# Patient Record
Sex: Female | Born: 1980 | Race: White | Hispanic: No | Marital: Married | Smoking: Never smoker
Health system: Southern US, Community
[De-identification: ages and names within clinical notes are randomized; demographics above are authoritative.]

## PROBLEM LIST (undated history)

## (undated) DIAGNOSIS — Z789 Other specified health status: Secondary | ICD-10-CM

## (undated) DIAGNOSIS — D649 Anemia, unspecified: Secondary | ICD-10-CM

## (undated) DIAGNOSIS — Z8619 Personal history of other infectious and parasitic diseases: Secondary | ICD-10-CM

## (undated) HISTORY — DX: Anemia, unspecified: D64.9

## (undated) HISTORY — PX: DILATION AND CURETTAGE OF UTERUS: SHX78

## (undated) HISTORY — DX: Personal history of other infectious and parasitic diseases: Z86.19

---

## 2003-09-29 ENCOUNTER — Emergency Department (HOSPITAL_COMMUNITY): Admission: AD | Admit: 2003-09-29 | Discharge: 2003-09-29 | Payer: Self-pay | Admitting: Family Medicine

## 2003-12-11 ENCOUNTER — Emergency Department (HOSPITAL_COMMUNITY): Admission: EM | Admit: 2003-12-11 | Discharge: 2003-12-11 | Payer: Self-pay | Admitting: Family Medicine

## 2005-05-01 ENCOUNTER — Emergency Department (HOSPITAL_COMMUNITY): Admission: EM | Admit: 2005-05-01 | Discharge: 2005-05-01 | Payer: Self-pay | Admitting: Family Medicine

## 2005-11-04 ENCOUNTER — Inpatient Hospital Stay (HOSPITAL_COMMUNITY): Admission: AD | Admit: 2005-11-04 | Discharge: 2005-11-04 | Payer: Self-pay | Admitting: Gynecology

## 2005-11-18 ENCOUNTER — Inpatient Hospital Stay (HOSPITAL_COMMUNITY): Admission: RE | Admit: 2005-11-18 | Discharge: 2005-11-18 | Payer: Self-pay | Admitting: Gynecology

## 2006-01-25 ENCOUNTER — Other Ambulatory Visit: Admission: RE | Admit: 2006-01-25 | Discharge: 2006-01-25 | Payer: Self-pay | Admitting: Obstetrics and Gynecology

## 2006-03-25 ENCOUNTER — Inpatient Hospital Stay (HOSPITAL_COMMUNITY): Admission: AD | Admit: 2006-03-25 | Discharge: 2006-03-25 | Payer: Self-pay | Admitting: Obstetrics and Gynecology

## 2006-07-17 ENCOUNTER — Inpatient Hospital Stay (HOSPITAL_COMMUNITY): Admission: AD | Admit: 2006-07-17 | Discharge: 2006-07-19 | Payer: Self-pay | Admitting: *Deleted

## 2007-09-29 ENCOUNTER — Emergency Department (HOSPITAL_COMMUNITY): Admission: EM | Admit: 2007-09-29 | Discharge: 2007-09-29 | Payer: Self-pay | Admitting: Emergency Medicine

## 2007-10-10 ENCOUNTER — Inpatient Hospital Stay (HOSPITAL_COMMUNITY): Admission: AD | Admit: 2007-10-10 | Discharge: 2007-10-10 | Payer: Self-pay | Admitting: Obstetrics & Gynecology

## 2007-12-02 ENCOUNTER — Ambulatory Visit: Payer: Self-pay | Admitting: Physician Assistant

## 2007-12-02 ENCOUNTER — Inpatient Hospital Stay (HOSPITAL_COMMUNITY): Admission: AD | Admit: 2007-12-02 | Discharge: 2007-12-02 | Payer: Self-pay | Admitting: Obstetrics & Gynecology

## 2007-12-08 ENCOUNTER — Ambulatory Visit (HOSPITAL_COMMUNITY): Admission: RE | Admit: 2007-12-08 | Discharge: 2007-12-08 | Payer: Self-pay | Admitting: Family Medicine

## 2008-02-17 ENCOUNTER — Ambulatory Visit (HOSPITAL_COMMUNITY): Admission: RE | Admit: 2008-02-17 | Discharge: 2008-02-17 | Payer: Self-pay | Admitting: Obstetrics & Gynecology

## 2008-03-24 ENCOUNTER — Ambulatory Visit: Payer: Self-pay | Admitting: Advanced Practice Midwife

## 2008-03-24 ENCOUNTER — Inpatient Hospital Stay (HOSPITAL_COMMUNITY): Admission: AD | Admit: 2008-03-24 | Discharge: 2008-03-24 | Payer: Self-pay | Admitting: Gynecology

## 2008-04-02 ENCOUNTER — Ambulatory Visit (HOSPITAL_COMMUNITY): Admission: RE | Admit: 2008-04-02 | Discharge: 2008-04-02 | Payer: Self-pay | Admitting: Family Medicine

## 2008-05-04 ENCOUNTER — Ambulatory Visit (HOSPITAL_COMMUNITY): Admission: RE | Admit: 2008-05-04 | Discharge: 2008-05-04 | Payer: Self-pay | Admitting: Family Medicine

## 2008-05-04 ENCOUNTER — Ambulatory Visit: Payer: Self-pay | Admitting: Family Medicine

## 2008-05-10 ENCOUNTER — Ambulatory Visit: Payer: Self-pay | Admitting: Obstetrics and Gynecology

## 2008-05-10 ENCOUNTER — Inpatient Hospital Stay (HOSPITAL_COMMUNITY): Admission: AD | Admit: 2008-05-10 | Discharge: 2008-05-12 | Payer: Self-pay | Admitting: Obstetrics and Gynecology

## 2008-07-01 ENCOUNTER — Emergency Department (HOSPITAL_COMMUNITY): Admission: EM | Admit: 2008-07-01 | Discharge: 2008-07-01 | Payer: Self-pay | Admitting: Family Medicine

## 2009-03-19 ENCOUNTER — Emergency Department (HOSPITAL_COMMUNITY): Admission: EM | Admit: 2009-03-19 | Discharge: 2009-03-19 | Payer: Self-pay | Admitting: Family Medicine

## 2009-07-15 ENCOUNTER — Emergency Department (HOSPITAL_COMMUNITY): Admission: EM | Admit: 2009-07-15 | Discharge: 2009-07-15 | Payer: Self-pay | Admitting: Family Medicine

## 2009-12-04 ENCOUNTER — Emergency Department (HOSPITAL_COMMUNITY): Admission: EM | Admit: 2009-12-04 | Discharge: 2009-12-04 | Payer: Self-pay | Admitting: Family Medicine

## 2010-01-15 ENCOUNTER — Emergency Department (HOSPITAL_COMMUNITY): Admission: EM | Admit: 2010-01-15 | Discharge: 2010-01-15 | Payer: Self-pay | Admitting: Family Medicine

## 2010-05-02 ENCOUNTER — Encounter: Admission: RE | Admit: 2010-05-02 | Discharge: 2010-05-02 | Payer: Self-pay | Admitting: Emergency Medicine

## 2010-07-13 ENCOUNTER — Encounter: Payer: Self-pay | Admitting: Emergency Medicine

## 2010-11-07 NOTE — H&P (Signed)
[name], [name]                ACCOUNT NO.:  [id number]   MEDICAL RECORD NO.:  [id number]          PATIENT TYPE:  MAT   LOCATION:  [facility]                          FACILITY:  [facility]   PHYSICIAN:  Crist Fat. Rivard, M.D. DATE OF BIRTH:  06-19-1981   DATE OF ADMISSION:  07/17/2006  DATE OF DISCHARGE:                              HISTORY & PHYSICAL   This is a 30 year old gravida 1, para 0, at 40-6/7 weeks who presents  with contractions every three to four minutes.  She also started leaking  once she got here.  Positive fetal movement.  Pregnancy has been  remarkable for:  1. Unsure LMP.  2. Low BMI.  3. GC exposure which was treated.  4. Group B strep positive.   ALLERGIES:  None.   OB HISTORY:  The patient is primigravida.   PAST MEDICAL HISTORY:  1. Remarkable for Chlamydia in 2005.  2. Childhood varicella.  3. Partner with positive GC in July 2007 and the patient was treated.   PAST SURGICAL HISTORY:  Negative.   FAMILY HISTORY:  Remarkable for mother with hypertension.  Father with  diabetes.  Grandmother and aunts with diabetes.  Grandmother with  stroke.  Mother with breast cancer.  Grandfather with prostate cancer.   GENETIC HISTORY:  Remarkable for father of the baby with hole in his  heart which was not corrected with surgery.   SOCIAL HISTORY:  The patient is single. The father of the baby, [name], is involved and supportive. She does not report a religious  affiliation.  She works as a Conservation officer, nature.  She denies any alcohol, tobacco  or drug use.   PRENATAL LABS:  Hemoglobin 12, platelets 342, blood type O positive,  antibody screen negative.  Sickle cell negative.  RPR nonreactive.  Rubella immune.  Hepatitis negative.  HIV negative.  AFP negative.   HISTORY OF PRESENT PREGNANCY:  The patient entered care at 13 weeks.  She has had exposure to gonorrhea and was treated in July of 2007 with  negative test of cure in August.  She had a normal Pap smear at  that  time.  She had an ultrasound at 18 weeks which was normal.  She had a  Glucola at 26 weeks which was normal.  She had an ultrasound at 33 weeks  which showed 42 percentile growth and normal AFI.  She had a positive  group B strep at term and was treated for BV at 39 weeks.   PHYSICAL EXAMINATION:  VITAL SIGNS:  Stable.  afebrile.  HEENT:  Within normal limits.  NECK:  Thyroid normal, not enlarged.  CHEST:  Clear to auscultation.  HEART:  Regular rate and rhythm.  ABDOMEN:  Gravid at 38 cm.  Vertex by Leopold's.  EFM shows reactive  fetal heart rate with contractions every four minutes. She is leaking  green tinged fluid,  positive Nitrazine.  CERVIX:  1 cm, 90% effaced, minus 2 station with a vertex presentation.  EXTREMITIES:  Within normal limits.   ASSESSMENT:  1. Intrauterine pregnancy at 40-6/7 weeks.  2. Early active  labor.  3. Spontaneous rupture of membranes.  4. Meconium fluid.  5. Group B strep positive.   PLAN:  1. Admit to birthing suite per Dr. Estanislado Pandy.  2. Routine CNM orders.  3. Penicillin prophylaxis.  4. Epidural.  5. IUPC with amnio infusion.      Marie L. Williams, C.N.M.      Crist Fat Rivard, M.D.  Electronically Signed    MLW/MEDQ  D:  07/17/2006  T:  07/17/2006  Job:  [id number]

## 2011-03-17 LAB — POCT URINALYSIS DIP (DEVICE)
Ketones, ur: 15 — AB
Operator id: 208841

## 2011-03-17 LAB — URINE MICROSCOPIC-ADD ON

## 2011-03-17 LAB — URINE CULTURE

## 2011-03-17 LAB — URINALYSIS, ROUTINE W REFLEX MICROSCOPIC
Nitrite: NEGATIVE
Specific Gravity, Urine: >1.03 — ABNORMAL HIGH
pH: 5.5

## 2011-03-17 LAB — GC/CHLAMYDIA PROBE AMP, GENITAL: GC Probe Amp, Genital: NEGATIVE

## 2011-03-17 LAB — POCT PREGNANCY, URINE
Operator id: 247071
Operator id: 239321

## 2011-03-17 LAB — WET PREP, GENITAL
Trich, Wet Prep: NONE SEEN
Yeast Wet Prep HPF POC: NONE SEEN

## 2011-03-23 LAB — URINE MICROSCOPIC-ADD ON

## 2011-03-23 LAB — URINALYSIS, ROUTINE W REFLEX MICROSCOPIC
Glucose, UA: NEGATIVE
Hgb urine dipstick: NEGATIVE
Protein, ur: NEGATIVE

## 2011-03-24 LAB — CBC
HCT: 35.8 — ABNORMAL LOW
Platelets: 200
WBC: 8.1

## 2012-01-14 ENCOUNTER — Emergency Department (HOSPITAL_BASED_OUTPATIENT_CLINIC_OR_DEPARTMENT_OTHER)
Admission: EM | Admit: 2012-01-14 | Discharge: 2012-01-14 | Disposition: A | Discharge status: Home / Self Care | Payer: Self-pay

## 2015-02-19 LAB — OB RESULTS CONSOLE HEPATITIS B SURFACE ANTIGEN: Hepatitis B Surface Ag: NEGATIVE

## 2015-02-19 LAB — OB RESULTS CONSOLE ABO/RH: RH Type: POSITIVE

## 2015-02-19 LAB — OB RESULTS CONSOLE ANTIBODY SCREEN: ANTIBODY SCREEN: NEGATIVE

## 2015-02-19 LAB — OB RESULTS CONSOLE HIV ANTIBODY (ROUTINE TESTING): HIV: NONREACTIVE

## 2015-02-19 LAB — OB RESULTS CONSOLE RPR: RPR: NONREACTIVE

## 2015-02-19 LAB — OB RESULTS CONSOLE RUBELLA ANTIBODY, IGM: Rubella: IMMUNE

## 2015-02-21 LAB — OB RESULTS CONSOLE GC/CHLAMYDIA
Chlamydia: NEGATIVE
GC PROBE AMP, GENITAL: NEGATIVE

## 2015-04-15 ENCOUNTER — Other Ambulatory Visit (HOSPITAL_COMMUNITY): Payer: Self-pay | Admitting: Obstetrics

## 2015-04-15 DIAGNOSIS — Z3689 Encounter for other specified antenatal screening: Secondary | ICD-10-CM

## 2015-04-15 DIAGNOSIS — Z0374 Encounter for suspected problem with fetal growth ruled out: Secondary | ICD-10-CM

## 2015-04-15 DIAGNOSIS — Z3A23 23 weeks gestation of pregnancy: Secondary | ICD-10-CM

## 2015-04-22 ENCOUNTER — Ambulatory Visit (HOSPITAL_COMMUNITY): Payer: Self-pay

## 2015-04-23 ENCOUNTER — Other Ambulatory Visit (HOSPITAL_COMMUNITY): Payer: Self-pay | Admitting: Obstetrics

## 2015-04-23 ENCOUNTER — Encounter (HOSPITAL_COMMUNITY): Payer: Self-pay

## 2015-04-23 ENCOUNTER — Ambulatory Visit (HOSPITAL_COMMUNITY)
Admission: RE | Admit: 2015-04-23 | Discharge: 2015-04-23 | Disposition: A | Discharge status: Home / Self Care | Payer: BLUE CROSS/BLUE SHIELD | Source: Ambulatory Visit | Attending: Obstetrics | Admitting: Obstetrics

## 2015-04-23 ENCOUNTER — Ambulatory Visit (HOSPITAL_COMMUNITY)
Admission: RE | Admit: 2015-04-23 | Discharge: 2015-04-23 | Disposition: A | Discharge status: Home / Self Care | Payer: BLUE CROSS/BLUE SHIELD | Source: Ambulatory Visit

## 2015-04-23 DIAGNOSIS — O365920 Maternal care for other known or suspected poor fetal growth, second trimester, not applicable or unspecified: Secondary | ICD-10-CM

## 2015-04-23 DIAGNOSIS — O283 Abnormal ultrasonic finding on antenatal screening of mother: Secondary | ICD-10-CM | POA: Insufficient documentation

## 2015-04-23 DIAGNOSIS — O289 Unspecified abnormal findings on antenatal screening of mother: Secondary | ICD-10-CM

## 2015-04-23 DIAGNOSIS — O358XX0 Maternal care for other (suspected) fetal abnormality and damage, not applicable or unspecified: Secondary | ICD-10-CM | POA: Insufficient documentation

## 2015-04-23 DIAGNOSIS — Z3A24 24 weeks gestation of pregnancy: Secondary | ICD-10-CM | POA: Diagnosis not present

## 2015-04-23 DIAGNOSIS — O359XX0 Maternal care for (suspected) fetal abnormality and damage, unspecified, not applicable or unspecified: Secondary | ICD-10-CM

## 2015-04-23 DIAGNOSIS — Z3689 Encounter for other specified antenatal screening: Secondary | ICD-10-CM

## 2015-04-23 DIAGNOSIS — Z0374 Encounter for suspected problem with fetal growth ruled out: Secondary | ICD-10-CM

## 2015-04-23 DIAGNOSIS — O365990 Maternal care for other known or suspected poor fetal growth, unspecified trimester, not applicable or unspecified: Secondary | ICD-10-CM

## 2015-04-23 DIAGNOSIS — Z3A23 23 weeks gestation of pregnancy: Secondary | ICD-10-CM

## 2015-04-26 DIAGNOSIS — O365990 Maternal care for other known or suspected poor fetal growth, unspecified trimester, not applicable or unspecified: Secondary | ICD-10-CM | POA: Insufficient documentation

## 2015-04-26 DIAGNOSIS — O283 Abnormal ultrasonic finding on antenatal screening of mother: Secondary | ICD-10-CM | POA: Insufficient documentation

## 2015-04-26 NOTE — Progress Notes (Signed)
Genetic Counseling  High-Risk Gestation Note  Appointment Date:  04/23/2015 Referred By: Aloha Gell, MD Date of Birth:  01/03/1981   Pregnancy History: [id number] Estimated Date of Delivery: 08/13/15 Estimated Gestational Age: 38w0dAttending: PBenjaman Lobe MD  I met with Mrs. LVirgie Shepherd genetic counseling because of abnormal ultrasound findings.  In Summary:  Detailed ultrasound performed today and visualized fetal growth restriction, absent nasal bone, echogenic intracardiac focus, and hypocoiled umbilical cord  Reviewed various etiologies  Regarding increased risk for chromosome condition, specifically discussed increased risk for Down syndrome of approximately 1 in 85 (above patient's Quad screen result)  Mrs. FRoundtreedeclined NIPS and amniocentesis; She stated her OB office is planning blood work on 11/18, so she would prefer to pursue NIPS at that same time   We began by reviewing the ultrasound in detail. Ultrasound today visualized fetal growth restriction. Additionally, absent nasal bone and echogenic intracardiac focus were visualized. Hypocoiled umbilical cord was noted. Complete ultrasound results reported separately.   We discussed that there can be various etiologies for fetal growth restriction including utereoplacental insufficiency, chromosome abnormalities, genetic syndromes, and infections. Regarding the echogenic focus, we discussed that this alone is generally believed to be a normal variation without any concerns for the pregnancy.  Isolated echogenic cardiac foci are not associated with congenital heart defects in the baby or compromised cardiac function after birth.  However, an echogenic cardiac focus is associated with a slightly increased chance for Down syndrome in the pregnancy.   We discussed that a hypoplastic nasal bone is a highly sensitive and specific marker for Down syndrome.  It is present in approximately 1% of chromosomally normal fetuses,  but up to 70% of fetuses with Down syndrome, 55 % with trisomy 18, and 34% with trisomy 13. The nasal bone is typically considered to be hypoplastic if it is less than 2.5 mm in length.  The length of the fetal nasal bone varies by race and ethnicity in second trimester fetuses.  The associated risk of aneuploidy is highest in Caucasians, with a LR of ~50 and is lower in individuals with African ancestry, with a LR of approximately 8.5.    Mrs. LVirgie Dadpreviously had Quad screening performed, which was within normal range for the conditions screened. The risk for fetal Down syndrome was reduced to 1 in 850. We discussed that the ultrasound findings would increase the risk for Down syndrome above her Quad screen risk to approximately 1 in 85 (1.2%).    We reviewed chromosomes, nondisjunction, and the common features and variable prognosis of Down syndrome.  We also reviewed other aneuploidies including trisomies 125and 147 however, Ms. * was counseled that these conditions are less likely given the ultrasound findings.  We reviewed other available screening and diagnostic options including noninvasive prenatal screening (NIPS)/cell free DNA testing and amniocentesis.  We reviewed the conditions for which NIPS assesses, the detection rates, and false positive rates of each. We discussed the risks, benefits, and limitations of amniocentesis including the associated 1 in 3427-062risk for complications, including spontaneous preterm delivery.  We discussed that in addition to karyotype analysis, chromosome microarray analysis can be performed on amniocentesis. After consideration of all the options, she declined NIPS and amniocentesis today. She expressed interest in NIPS but stated that blood work is already planned at her OB office on 11/18, and she would prefer to have NIPS at her OB office at that time in conjunction with additional blood work. She declined  amniocentesis.   Additionally, we discussed  that the ultrasound findings increase the chance for a single gene condition. The patient understands that single gene conditions are typically not assessed for prenatally unless ultrasound findings and/or family history are suggestive of a particular genetic condition for which prenatal testing via amniocentesis is available.   We discussed that the prognosis depends upon the underlying etiology and/or the degree of fetal growth restriction in pregnancy. Follow-up ultrasound is scheduled for 04/30/15, 05/07/15, and 05/14/15.   Both family histories were briefly reviewed and found to be noncontributory for birth defects, intellectual disability, and known genetic conditions. Without further information regarding the provided family history, an accurate genetic risk cannot be calculated. Further genetic counseling is warranted if more information is obtained.  Mrs. Melanie Dad denied exposure to environmental toxins or chemical agents. She denied the use of alcohol, tobacco or street drugs. She denied significant viral illnesses during the course of her pregnancy. Her medical and surgical histories were noncontributory.   I counseled Mrs. Melanie Dad regarding the above risks and available options.  The approximate face-to-face time with the genetic counselor was 30 minutes.  Chipper Oman, MS Certified Genetic Counselor 04/26/2015

## 2015-04-30 ENCOUNTER — Ambulatory Visit (HOSPITAL_COMMUNITY): Admission: RE | Admit: 2015-04-30 | Payer: BLUE CROSS/BLUE SHIELD | Source: Ambulatory Visit

## 2015-04-30 ENCOUNTER — Ambulatory Visit (HOSPITAL_COMMUNITY)
Admission: RE | Admit: 2015-04-30 | Discharge: 2015-04-30 | Disposition: A | Discharge status: Home / Self Care | Payer: BLUE CROSS/BLUE SHIELD | Source: Ambulatory Visit | Attending: Obstetrics | Admitting: Obstetrics

## 2015-04-30 DIAGNOSIS — O365920 Maternal care for other known or suspected poor fetal growth, second trimester, not applicable or unspecified: Secondary | ICD-10-CM | POA: Insufficient documentation

## 2015-05-07 ENCOUNTER — Ambulatory Visit (HOSPITAL_COMMUNITY)
Admission: RE | Admit: 2015-05-07 | Discharge: 2015-05-07 | Disposition: A | Discharge status: Home / Self Care | Payer: BLUE CROSS/BLUE SHIELD | Source: Ambulatory Visit | Attending: Obstetrics | Admitting: Obstetrics

## 2015-05-07 ENCOUNTER — Other Ambulatory Visit (HOSPITAL_COMMUNITY): Payer: Self-pay | Admitting: Maternal and Fetal Medicine

## 2015-05-07 VITALS — BP 113/65 | HR 99 | Wt 124.0 lb

## 2015-05-07 DIAGNOSIS — Z3A26 26 weeks gestation of pregnancy: Secondary | ICD-10-CM | POA: Diagnosis not present

## 2015-05-07 DIAGNOSIS — O289 Unspecified abnormal findings on antenatal screening of mother: Secondary | ICD-10-CM

## 2015-05-07 DIAGNOSIS — O365990 Maternal care for other known or suspected poor fetal growth, unspecified trimester, not applicable or unspecified: Secondary | ICD-10-CM

## 2015-05-07 DIAGNOSIS — O359XX0 Maternal care for (suspected) fetal abnormality and damage, unspecified, not applicable or unspecified: Secondary | ICD-10-CM

## 2015-05-07 DIAGNOSIS — O358XX0 Maternal care for other (suspected) fetal abnormality and damage, not applicable or unspecified: Secondary | ICD-10-CM | POA: Insufficient documentation

## 2015-05-07 DIAGNOSIS — O283 Abnormal ultrasonic finding on antenatal screening of mother: Secondary | ICD-10-CM | POA: Insufficient documentation

## 2015-05-07 DIAGNOSIS — O365920 Maternal care for other known or suspected poor fetal growth, second trimester, not applicable or unspecified: Secondary | ICD-10-CM | POA: Insufficient documentation

## 2015-05-08 ENCOUNTER — Other Ambulatory Visit (HOSPITAL_COMMUNITY): Payer: Self-pay | Admitting: Maternal and Fetal Medicine

## 2015-05-08 DIAGNOSIS — O365990 Maternal care for other known or suspected poor fetal growth, unspecified trimester, not applicable or unspecified: Secondary | ICD-10-CM

## 2015-05-09 ENCOUNTER — Telehealth (HOSPITAL_COMMUNITY): Payer: Self-pay | Admitting: MS"

## 2015-05-09 ENCOUNTER — Encounter (HOSPITAL_COMMUNITY): Payer: Self-pay

## 2015-05-09 NOTE — Telephone Encounter (Signed)
Called [name] to discuss her prenatal cell free DNA test results.  [name] had Panorama testing through [facility].  Testing was offered because of abnormal ultrasound findings.   The patient was identified by name and DOB.  We reviewed that these are within normal limits, showing a less than 1 in 10,000 risk for trisomies 21, 18 and 13, and monosomy X (Turner syndrome).  In addition, the risk for triploidy/vanishing twin and sex chromosome trisomies (47,XXX and 47,XXY) was also low risk.  We reviewed that this testing identifies > 99% of pregnancies with trisomy 4821, trisomy 8513, sex chromosome trisomies (47,XXX and 47,XXY), and triploidy. The detection rate for trisomy 18 is 96%.  The detection rate for monosomy X is ~92%.  The false positive rate is <0.1% for all conditions. Testing was also consistent with female fetal sex. She understands that this testing does not identify all genetic conditions.  All questions were answered to her satisfaction, she was encouraged to call with additional questions or concerns.  Quinn PlowmanKaren Melvinia Ashby, MS Certified Genetic Counselor 05/09/2015 3:33 PM

## 2015-05-14 ENCOUNTER — Ambulatory Visit (HOSPITAL_COMMUNITY)
Admission: RE | Admit: 2015-05-14 | Discharge: 2015-05-14 | Disposition: A | Discharge status: Home / Self Care | Payer: BLUE CROSS/BLUE SHIELD | Source: Ambulatory Visit | Attending: Obstetrics | Admitting: Obstetrics

## 2015-05-14 ENCOUNTER — Encounter (HOSPITAL_COMMUNITY): Payer: Self-pay

## 2015-05-14 VITALS — BP 108/63 | HR 92 | Wt 126.8 lb

## 2015-05-14 DIAGNOSIS — O283 Abnormal ultrasonic finding on antenatal screening of mother: Secondary | ICD-10-CM | POA: Diagnosis not present

## 2015-05-14 DIAGNOSIS — O358XX0 Maternal care for other (suspected) fetal abnormality and damage, not applicable or unspecified: Secondary | ICD-10-CM | POA: Insufficient documentation

## 2015-05-14 DIAGNOSIS — Z3A27 27 weeks gestation of pregnancy: Secondary | ICD-10-CM | POA: Insufficient documentation

## 2015-05-14 DIAGNOSIS — O365920 Maternal care for other known or suspected poor fetal growth, second trimester, not applicable or unspecified: Secondary | ICD-10-CM

## 2015-05-14 DIAGNOSIS — O365990 Maternal care for other known or suspected poor fetal growth, unspecified trimester, not applicable or unspecified: Secondary | ICD-10-CM

## 2015-05-21 ENCOUNTER — Ambulatory Visit (HOSPITAL_COMMUNITY)
Admission: RE | Admit: 2015-05-21 | Discharge: 2015-05-21 | Disposition: A | Discharge status: Home / Self Care | Payer: BLUE CROSS/BLUE SHIELD | Source: Ambulatory Visit | Attending: Obstetrics | Admitting: Obstetrics

## 2015-05-21 ENCOUNTER — Encounter (HOSPITAL_COMMUNITY): Payer: Self-pay

## 2015-05-21 DIAGNOSIS — O365990 Maternal care for other known or suspected poor fetal growth, unspecified trimester, not applicable or unspecified: Secondary | ICD-10-CM

## 2015-05-28 ENCOUNTER — Encounter (HOSPITAL_COMMUNITY): Payer: Self-pay

## 2015-05-28 ENCOUNTER — Other Ambulatory Visit (HOSPITAL_COMMUNITY): Payer: Self-pay | Admitting: Maternal and Fetal Medicine

## 2015-05-28 ENCOUNTER — Ambulatory Visit (HOSPITAL_COMMUNITY)
Admission: RE | Admit: 2015-05-28 | Discharge: 2015-05-28 | Disposition: A | Discharge status: Home / Self Care | Payer: BLUE CROSS/BLUE SHIELD | Source: Ambulatory Visit | Attending: Obstetrics | Admitting: Obstetrics

## 2015-05-28 DIAGNOSIS — O283 Abnormal ultrasonic finding on antenatal screening of mother: Secondary | ICD-10-CM | POA: Diagnosis not present

## 2015-05-28 DIAGNOSIS — O358XX0 Maternal care for other (suspected) fetal abnormality and damage, not applicable or unspecified: Secondary | ICD-10-CM | POA: Diagnosis not present

## 2015-05-28 DIAGNOSIS — O365933 Maternal care for other known or suspected poor fetal growth, third trimester, fetus 3: Secondary | ICD-10-CM | POA: Insufficient documentation

## 2015-05-28 DIAGNOSIS — O365990 Maternal care for other known or suspected poor fetal growth, unspecified trimester, not applicable or unspecified: Secondary | ICD-10-CM

## 2015-05-28 DIAGNOSIS — O359XX0 Maternal care for (suspected) fetal abnormality and damage, unspecified, not applicable or unspecified: Secondary | ICD-10-CM

## 2015-05-28 DIAGNOSIS — O289 Unspecified abnormal findings on antenatal screening of mother: Secondary | ICD-10-CM

## 2015-05-28 DIAGNOSIS — Z3A29 29 weeks gestation of pregnancy: Secondary | ICD-10-CM

## 2015-05-28 HISTORY — DX: Other specified health status: Z78.9

## 2015-05-30 ENCOUNTER — Encounter (HOSPITAL_COMMUNITY): Payer: Self-pay

## 2015-06-04 ENCOUNTER — Other Ambulatory Visit (HOSPITAL_COMMUNITY): Payer: Self-pay | Admitting: Maternal and Fetal Medicine

## 2015-06-04 ENCOUNTER — Encounter (HOSPITAL_COMMUNITY): Payer: Self-pay

## 2015-06-04 ENCOUNTER — Ambulatory Visit (HOSPITAL_COMMUNITY)
Admission: RE | Admit: 2015-06-04 | Discharge: 2015-06-04 | Disposition: A | Discharge status: Home / Self Care | Payer: BLUE CROSS/BLUE SHIELD | Source: Ambulatory Visit | Attending: Obstetrics | Admitting: Obstetrics

## 2015-06-04 DIAGNOSIS — O365930 Maternal care for other known or suspected poor fetal growth, third trimester, not applicable or unspecified: Secondary | ICD-10-CM | POA: Diagnosis present

## 2015-06-04 DIAGNOSIS — O289 Unspecified abnormal findings on antenatal screening of mother: Secondary | ICD-10-CM

## 2015-06-04 DIAGNOSIS — Z3A30 30 weeks gestation of pregnancy: Secondary | ICD-10-CM

## 2015-06-04 DIAGNOSIS — O365990 Maternal care for other known or suspected poor fetal growth, unspecified trimester, not applicable or unspecified: Secondary | ICD-10-CM

## 2015-06-04 DIAGNOSIS — O358XX0 Maternal care for other (suspected) fetal abnormality and damage, not applicable or unspecified: Secondary | ICD-10-CM | POA: Insufficient documentation

## 2015-06-04 DIAGNOSIS — O283 Abnormal ultrasonic finding on antenatal screening of mother: Secondary | ICD-10-CM

## 2015-06-11 ENCOUNTER — Ambulatory Visit (HOSPITAL_COMMUNITY): Payer: BLUE CROSS/BLUE SHIELD

## 2015-06-18 ENCOUNTER — Ambulatory Visit (HOSPITAL_COMMUNITY): Payer: BLUE CROSS/BLUE SHIELD

## 2015-06-23 NOTE — L&D Delivery Note (Signed)
Delivery Note At 11:26 PM a viable female was delivered via Vaginal, Spontaneous Delivery (Presentation: ; DOA ).  Loose nuchal cord reduced and delivered through. APGAR: 8/9, ; weight pending .   Placenta status: Intact, Spontaneous.  Cord: 3 vessels with the following complications: None.  Cord pH: not indicated  Anesthesia: Epidural  Episiotomy: None Lacerations: None Suture Repair: n/a Est. Blood Loss (mL): 300  Mom to postpartum.  Baby to Couplet care / Skin to Skin.  Joellen Tullos A. 08/09/2015, 11:42 PM

## 2015-06-25 ENCOUNTER — Other Ambulatory Visit (HOSPITAL_COMMUNITY): Payer: Self-pay | Admitting: Maternal and Fetal Medicine

## 2015-06-25 ENCOUNTER — Encounter (HOSPITAL_COMMUNITY): Payer: Self-pay

## 2015-06-25 ENCOUNTER — Ambulatory Visit (HOSPITAL_COMMUNITY)
Admission: RE | Admit: 2015-06-25 | Discharge: 2015-06-25 | Disposition: A | Discharge status: Home / Self Care | Payer: BLUE CROSS/BLUE SHIELD | Source: Ambulatory Visit | Attending: Maternal and Fetal Medicine | Admitting: Maternal and Fetal Medicine

## 2015-06-25 DIAGNOSIS — O283 Abnormal ultrasonic finding on antenatal screening of mother: Secondary | ICD-10-CM | POA: Diagnosis not present

## 2015-06-25 DIAGNOSIS — Z3A33 33 weeks gestation of pregnancy: Secondary | ICD-10-CM

## 2015-06-25 DIAGNOSIS — O365930 Maternal care for other known or suspected poor fetal growth, third trimester, not applicable or unspecified: Secondary | ICD-10-CM | POA: Insufficient documentation

## 2015-06-25 DIAGNOSIS — O359XX0 Maternal care for (suspected) fetal abnormality and damage, unspecified, not applicable or unspecified: Secondary | ICD-10-CM

## 2015-06-25 DIAGNOSIS — O358XX0 Maternal care for other (suspected) fetal abnormality and damage, not applicable or unspecified: Secondary | ICD-10-CM | POA: Insufficient documentation

## 2015-06-25 DIAGNOSIS — O365990 Maternal care for other known or suspected poor fetal growth, unspecified trimester, not applicable or unspecified: Secondary | ICD-10-CM

## 2015-06-25 DIAGNOSIS — O289 Unspecified abnormal findings on antenatal screening of mother: Secondary | ICD-10-CM

## 2015-07-17 ENCOUNTER — Ambulatory Visit (HOSPITAL_COMMUNITY)
Admission: RE | Admit: 2015-07-17 | Discharge: 2015-07-17 | Disposition: A | Discharge status: Home / Self Care | Payer: BLUE CROSS/BLUE SHIELD | Source: Ambulatory Visit | Attending: Obstetrics | Admitting: Obstetrics

## 2015-07-17 ENCOUNTER — Encounter (HOSPITAL_COMMUNITY): Payer: Self-pay

## 2015-07-17 ENCOUNTER — Other Ambulatory Visit (HOSPITAL_COMMUNITY): Payer: Self-pay | Admitting: Maternal and Fetal Medicine

## 2015-07-17 DIAGNOSIS — Z3A36 36 weeks gestation of pregnancy: Secondary | ICD-10-CM | POA: Insufficient documentation

## 2015-07-17 DIAGNOSIS — O283 Abnormal ultrasonic finding on antenatal screening of mother: Secondary | ICD-10-CM

## 2015-07-17 DIAGNOSIS — O365930 Maternal care for other known or suspected poor fetal growth, third trimester, not applicable or unspecified: Secondary | ICD-10-CM

## 2015-07-17 DIAGNOSIS — O359XX0 Maternal care for (suspected) fetal abnormality and damage, unspecified, not applicable or unspecified: Secondary | ICD-10-CM | POA: Diagnosis not present

## 2015-07-17 DIAGNOSIS — O289 Unspecified abnormal findings on antenatal screening of mother: Secondary | ICD-10-CM

## 2015-07-17 LAB — OB RESULTS CONSOLE GBS: GBS: POSITIVE

## 2015-07-18 ENCOUNTER — Ambulatory Visit (HOSPITAL_COMMUNITY): Payer: BLUE CROSS/BLUE SHIELD

## 2015-07-29 ENCOUNTER — Other Ambulatory Visit: Payer: Self-pay | Admitting: Obstetrics

## 2015-07-31 ENCOUNTER — Telehealth (HOSPITAL_COMMUNITY): Payer: Self-pay | Admitting: *Deleted

## 2015-07-31 ENCOUNTER — Encounter (HOSPITAL_COMMUNITY): Payer: Self-pay | Admitting: *Deleted

## 2015-07-31 NOTE — Telephone Encounter (Signed)
Preadmission screen  

## 2015-08-09 ENCOUNTER — Encounter (HOSPITAL_COMMUNITY): Payer: Self-pay

## 2015-08-09 ENCOUNTER — Inpatient Hospital Stay (HOSPITAL_COMMUNITY)
Admission: RE | Admit: 2015-08-09 | Discharge: 2015-08-11 | DRG: 775 | Disposition: A | Discharge status: Home / Self Care | Payer: BLUE CROSS/BLUE SHIELD | Source: Ambulatory Visit | Attending: Obstetrics | Admitting: Obstetrics

## 2015-08-09 ENCOUNTER — Inpatient Hospital Stay (HOSPITAL_COMMUNITY): Payer: BLUE CROSS/BLUE SHIELD | Admitting: Anesthesiology

## 2015-08-09 VITALS — BP 107/65 | HR 85 | Temp 98.5°F | Resp 16 | Ht 59.0 in | Wt 135.0 lb

## 2015-08-09 DIAGNOSIS — O365930 Maternal care for other known or suspected poor fetal growth, third trimester, not applicable or unspecified: Secondary | ICD-10-CM | POA: Diagnosis present

## 2015-08-09 DIAGNOSIS — O283 Abnormal ultrasonic finding on antenatal screening of mother: Secondary | ICD-10-CM

## 2015-08-09 DIAGNOSIS — Z833 Family history of diabetes mellitus: Secondary | ICD-10-CM

## 2015-08-09 DIAGNOSIS — O99824 Streptococcus B carrier state complicating childbirth: Secondary | ICD-10-CM | POA: Diagnosis present

## 2015-08-09 DIAGNOSIS — O26893 Other specified pregnancy related conditions, third trimester: Secondary | ICD-10-CM | POA: Diagnosis present

## 2015-08-09 DIAGNOSIS — O365990 Maternal care for other known or suspected poor fetal growth, unspecified trimester, not applicable or unspecified: Secondary | ICD-10-CM

## 2015-08-09 DIAGNOSIS — Z3490 Encounter for supervision of normal pregnancy, unspecified, unspecified trimester: Secondary | ICD-10-CM

## 2015-08-09 DIAGNOSIS — Z3A39 39 weeks gestation of pregnancy: Secondary | ICD-10-CM

## 2015-08-09 LAB — CBC
HCT: 30.1 % — ABNORMAL LOW (ref 36.0–46.0)
HEMOGLOBIN: 10 g/dL — AB (ref 12.0–15.0)
MCH: 28 pg (ref 26.0–34.0)
MCHC: 33.2 g/dL (ref 30.0–36.0)
MCV: 84.3 fL (ref 78.0–100.0)
Platelets: 200 10*3/uL (ref 150–400)
RBC: 3.57 MIL/uL — AB (ref 3.87–5.11)
RDW: 14.2 % (ref 11.5–15.5)
WBC: 8.1 10*3/uL (ref 4.0–10.5)

## 2015-08-09 LAB — RPR: RPR: NONREACTIVE

## 2015-08-09 LAB — TYPE AND SCREEN
ABO/RH(D): O POS
ANTIBODY SCREEN: NEGATIVE

## 2015-08-09 MED ORDER — OXYTOCIN BOLUS FROM INFUSION: 500.0000 mL | INTRAVENOUS | Status: DC

## 2015-08-09 MED ORDER — OXYCODONE-ACETAMINOPHEN 5-325 MG PO TABS: 1.0000 | ORAL_TABLET | ORAL | Status: DC | PRN

## 2015-08-09 MED ORDER — CITRIC ACID-SODIUM CITRATE 334-500 MG/5ML PO SOLN
30.0000 mL | ORAL | Status: DC | PRN
Start: 2015-08-09 — End: 2015-08-10

## 2015-08-09 MED ORDER — ACETAMINOPHEN 325 MG PO TABS: 650.0000 mg | ORAL_TABLET | ORAL | Status: DC | PRN

## 2015-08-09 MED ORDER — LACTATED RINGERS IV SOLN
INTRAVENOUS | Status: DC
  Administered 2015-08-09 (×3): via INTRAVENOUS

## 2015-08-09 MED ORDER — TERBUTALINE SULFATE 1 MG/ML IJ SOLN
0.2500 mg | Freq: Once | INTRAMUSCULAR | Status: DC | PRN
Start: 2015-08-09 — End: 2015-08-10
  Filled 2015-08-09: qty 1

## 2015-08-09 MED ORDER — DIPHENHYDRAMINE HCL 50 MG/ML IJ SOLN: 12.5000 mg | INTRAMUSCULAR | Status: DC | PRN

## 2015-08-09 MED ORDER — ONDANSETRON HCL 4 MG/2ML IJ SOLN
4.0000 mg | Freq: Four times a day (QID) | INTRAMUSCULAR | Status: DC | PRN
Start: 2015-08-09 — End: 2015-08-10

## 2015-08-09 MED ORDER — PHENYLEPHRINE 40 MCG/ML (10ML) SYRINGE FOR IV PUSH (FOR BLOOD PRESSURE SUPPORT)
80.0000 ug | PREFILLED_SYRINGE | INTRAVENOUS | Status: DC | PRN
  Filled 2015-08-09: qty 2

## 2015-08-09 MED ORDER — PHENYLEPHRINE 40 MCG/ML (10ML) SYRINGE FOR IV PUSH (FOR BLOOD PRESSURE SUPPORT)
80.0000 ug | PREFILLED_SYRINGE | INTRAVENOUS | Status: DC | PRN
  Filled 2015-08-09: qty 2
  Filled 2015-08-09: qty 20

## 2015-08-09 MED ORDER — PENICILLIN G POTASSIUM 5000000 UNITS IJ SOLR
2.5000 10*6.[IU] | INTRAVENOUS | Status: DC
  Administered 2015-08-09 (×2): 2.5 10*6.[IU] via INTRAVENOUS
  Filled 2015-08-09 (×8): qty 2.5

## 2015-08-09 MED ORDER — EPHEDRINE 5 MG/ML INJ
10.0000 mg | INTRAVENOUS | Status: DC | PRN
  Filled 2015-08-09: qty 2

## 2015-08-09 MED ORDER — LIDOCAINE HCL (PF) 1 % IJ SOLN
INTRAMUSCULAR | Status: DC | PRN
  Administered 2015-08-09: 6 mL
  Administered 2015-08-09: 4 mL

## 2015-08-09 MED ORDER — LIDOCAINE HCL (PF) 1 % IJ SOLN
30.0000 mL | INTRAMUSCULAR | Status: DC | PRN
  Filled 2015-08-09: qty 30

## 2015-08-09 MED ORDER — FLEET ENEMA 7-19 GM/118ML RE ENEM: 1.0000 | ENEMA | RECTAL | Status: DC | PRN

## 2015-08-09 MED ORDER — LACTATED RINGERS IV SOLN
500.0000 mL | Freq: Once | INTRAVENOUS | Status: AC
  Administered 2015-08-09: 500 mL via INTRAVENOUS

## 2015-08-09 MED ORDER — LACTATED RINGERS IV SOLN
1.0000 m[IU]/min | INTRAVENOUS | Status: DC
  Administered 2015-08-09: 2 m[IU]/min via INTRAVENOUS

## 2015-08-09 MED ORDER — OXYTOCIN 10 UNIT/ML IJ SOLN: 2.5000 [IU]/h | INTRAVENOUS | Status: DC

## 2015-08-09 MED ORDER — LACTATED RINGERS IV SOLN: 500.0000 mL | Freq: Once | INTRAVENOUS | Status: DC

## 2015-08-09 MED ORDER — OXYCODONE-ACETAMINOPHEN 5-325 MG PO TABS: 2.0000 | ORAL_TABLET | ORAL | Status: DC | PRN

## 2015-08-09 MED ORDER — TERBUTALINE SULFATE 1 MG/ML IJ SOLN
0.2500 mg | Freq: Once | INTRAMUSCULAR | Status: DC | PRN
  Filled 2015-08-09: qty 1

## 2015-08-09 MED ORDER — LACTATED RINGERS IV SOLN: 500.0000 mL | INTRAVENOUS | Status: DC | PRN

## 2015-08-09 MED ORDER — OXYTOCIN 10 UNIT/ML IJ SOLN
1.0000 m[IU]/min | INTRAMUSCULAR | Status: DC
  Filled 2015-08-09: qty 4

## 2015-08-09 MED ORDER — FENTANYL 2.5 MCG/ML BUPIVACAINE 1/10 % EPIDURAL INFUSION (WH - ANES)
14.0000 mL/h | INTRAMUSCULAR | Status: DC | PRN
  Administered 2015-08-09 (×2): 14 mL/h via EPIDURAL
  Filled 2015-08-09: qty 125

## 2015-08-09 MED ORDER — PENICILLIN G POTASSIUM 5000000 UNITS IJ SOLR
5.0000 10*6.[IU] | Freq: Once | INTRAVENOUS | Status: AC
  Administered 2015-08-09: 5 10*6.[IU] via INTRAVENOUS
  Filled 2015-08-09: qty 5

## 2015-08-09 NOTE — Progress Notes (Signed)
S: Doing well, no complaints, pain well controlled with epidural placed after cervical foley bulb.   O: BP 96/57 mmHg  Pulse 88  Temp(Src) 98.5 F (36.9 C) (Oral)  Resp 20  Ht  (1.499 m)  Wt 61.236 kg (135 lb)  BMI 27.25 kg/m2  SpO2 99%  LMP 11/06/2014   FHT:  FHR: 140s bpm, variability: moderate,  accelerations:  Present,  decelerations:  Present occasional sharp variables UC:   regular, every 3 minutes SVE:   Dilation: 4 Effacement (%): 80 Station: -2 Exam by:: Dr.Yatziri Wainwright  foley bulb out, AROM clear  A / P:  35 y.o.  Obstetric History   G7   P2   T2   P0   A4   TAB4   SAB0   E0   M0   L2    at [redacted]w[redacted]d IOL for IUGR Continue pitocin  Fetal Wellbeing:  Category I Pain Control:  Epidural  Anticipated MOD:  NSVD  Evah Rashid A. 08/09/2015, 8:56 PM

## 2015-08-09 NOTE — H&P (Signed)
[name] is a 35 y.o. [id number] at [redacted]w[redacted]d presenting for IOL. Pt notes no contractions . Good fetal movement, No vaginal bleeding, not leaking fluid.  [facility] - Unplanned but desired preg, late to care as unaware of pregnancy until 11 weeks - Dated by LMP c/w 12 wks - IUGR. At 12 wks, measured 5d behind, at 22 wks- 2 wks behind. Followed by MFM q 2-4 wks, hypercoiled umbilical cord but no etiology for IUGR. Normal Panorama.'Growth at 36 wks- 5'3, 18%. MFM recommended delivery by 40 wks. - anemia, on iron    Prenatal Transfer Tool  Maternal Diabetes: No Genetic Screening: Normal Maternal Ultrasounds/Referrals: Normal Fetal Ultrasounds or other Referrals:  Referred to Materal Fetal Medicine  Maternal Substance Abuse:  No Significant Maternal Medications:  None Significant Maternal Lab Results: None     OB History    Gravida Para Term Preterm AB TAB SAB Ectopic Multiple Living   0 4 4 0 0 0 2     Past Medical History  Diagnosis Date  . Medical history non-contributory   . Anemia   . Hx of varicella    Past Surgical History  Procedure Laterality Date  . Dilation and curettage of uterus     Family History: family history includes Cancer in her mother; Diabetes in her father, mother, and paternal grandmother. Social History:  reports that she has never smoked. She has never used smokeless tobacco. She reports that she does not drink alcohol or use illicit drugs.  Review of Systems - Negative except discomfort of pregnancy, anxiety over IOL     Blood pressure 114/78, pulse 128, temperature 98.3 F (36.8 C), resp. rate 18, height  (1.499 m), weight 61.236 kg (135 lb), last menstrual period 11/06/2014.  Physical Exam:  Gen: well appearing, no distress  Back: no CVAT Abd: gravid, NT, no RUQ pain LE: no edema, equal bilaterally, non-tender Toco: irritibility FH: baseline 130s, accelerations present, no deceleratons, 10 beat  variability  Prenatal labs: ABO, Rh: O/Positive/-- (08/30 0000) Antibody: Negative (08/30 0000) Rubella: !Error!immue RPR: Nonreactive (08/30 0000)  HBsAg: Negative (08/30 0000)  HIV: Non-reactive (08/30 0000)  GBS: Positive (01/25 0000)  1 hr Glucola 120  Genetic screening normal Panorama, nl quad Anatomy US IUGR   Assessment/Plan: 35 y.o. [id number] at [redacted]w[redacted]d IOL for IUGR at 39 wks. Plan pitocin, AROM when able GBS positive. Start PCN   [name] A. 08/09/2015, 7:49 AM

## 2015-08-09 NOTE — Progress Notes (Signed)
S: Doing well, no complaints, pain well controlled, not yet feeling contraction, planning epidural  O: BP 112/74 mmHg  Pulse 92  Temp(Src) 98.5 F (36.9 C) (Oral)  Resp 16  Ht  (1.499 m)  Wt 61.236 kg (135 lb)  BMI 27.25 kg/m2  LMP 11/06/2014   FHT:  FHR: 140 bpm, variability: moderate,  accelerations:  Present,  decelerations:  Absent UC:   irregular, every 2-5 minutes SVE:   Dilation: 1 Effacement (%): 50 Station: Ballotable Exam by:: Ernestina Penna, MD Cervical foley: SSE, betadine x 3, foley catheter threaded through cervix with use of a ring forcep, filled to 60 cc saline, placed on tension. Pt tolerated well with appropriate cramping.   A / P:  35 y.o.  Obstetric History   G7   P2   T2   P0   A4   TAB4   SAB0   E0   M0   L2    at [redacted]w[redacted]d IOL, IUGR, slow progress on pitocin, foley bulb just placed  Fetal Wellbeing:  Category I Pain Control:  Labor support without medications  Anticipated MOD:  NSVD  Taven Strite A. 08/09/2015, 5:32 PM

## 2015-08-09 NOTE — Anesthesia Preprocedure Evaluation (Signed)
Anesthesia Evaluation  Patient identified by MRN, date of birth, ID band Patient awake    Reviewed: Allergy & Precautions, NPO status , Patient's Chart, lab work & pertinent test results  Airway Mallampati: II  TM Distance: >3 FB Neck ROM: Full    Dental no notable dental hx.    Pulmonary neg pulmonary ROS,    Pulmonary exam normal breath sounds clear to auscultation       Cardiovascular negative cardio ROS Normal cardiovascular exam Rhythm:Regular Rate:Normal     Neuro/Psych negative neurological ROS  negative psych ROS   GI/Hepatic negative GI ROS, Neg liver ROS,   Endo/Other  negative endocrine ROS  Renal/GU negative Renal ROS  negative genitourinary   Musculoskeletal negative musculoskeletal ROS (+)   Abdominal   Peds negative pediatric ROS (+)  Hematology  (+) anemia ,   Anesthesia Other Findings   Reproductive/Obstetrics negative OB ROS                             Anesthesia Physical Anesthesia Plan  ASA: II  Anesthesia Plan: Epidural   Post-op Pain Management:    Induction: Intravenous  Airway Management Planned:   Additional Equipment:   Intra-op Plan:   Post-operative Plan: Extubation in OR  Informed Consent: I have reviewed the patients History and Physical, chart, labs and discussed the procedure including the risks, benefits and alternatives for the proposed anesthesia with the patient or authorized representative who has indicated his/her understanding and acceptance.   Dental advisory given  Plan Discussed with: CRNA  Anesthesia Plan Comments: (Informed consent obtained prior to proceeding including risk of failure, 1% risk of PDPH, risk of minor discomfort and bruising.  Discussed rare but serious complications including epidural abscess, permanent nerve injury, epidural hematoma.  Discussed alternatives to epidural analgesia and patient desires to proceed.   Timeout performed pre-procedure verifying patient name, procedure, and platelet count.  Patient tolerated procedure well. )        Anesthesia Quick Evaluation

## 2015-08-09 NOTE — Consults (Signed)
  Anesthesia Pain Consult Note  Patient: [name], 35 y.o., female  Consult Requested by: Noland Fordyce, MD  Reason for Consult: CRNA epidural Rounding  Level of Consciousness: alert  Pain: 0  Patient goal 2

## 2015-08-09 NOTE — Consults (Signed)
CRNA pain rounds. Patient planning epidural. Current pain score 0 [name] 08/09/2015

## 2015-08-09 NOTE — Progress Notes (Signed)
S: Doing well, no complaints, pain well controlled as not yet feeling strong contractions, planning epidural  O: BP 106/61 mmHg  Pulse 93  Temp(Src) 98.1 F (36.7 C) (Oral)  Resp 16  Ht  (1.499 m)  Wt 61.236 kg (135 lb)  BMI 27.25 kg/m2  LMP 11/06/2014   FHT:  FHR: 130s bpm, variability: moderate,  accelerations:  Present,  decelerations:  Absent UC:   irregular, every 3-5 minutes SVE:   Dilation: 1 Effacement (%): 50 Station: Ballotable Exam by:: Ernestina Penna, MD Attempt at AROM but unable to pass amnio-hook through 1 cm cvx, attempt to place cervical foley also unsuccessful.   A / P:  35 y.o.  Obstetric History   G7   P2   T2   P0   A4   TAB4   SAB0   E0   M0   L2    at [redacted]w[redacted]d IOL for unexplained IUGR  Fetal Wellbeing:  Category I Pain Control:  Labor support without medications  Anticipated MOD:  NSVD  Emunah Texidor A. 08/09/2015, 2:09 PM

## 2015-08-09 NOTE — Anesthesia Procedure Notes (Signed)
Epidural Patient location during procedure: OB  Staffing Anesthesiologist: Sherrian Divers Performed by: anesthesiologist   Preanesthetic Checklist Completed: patient identified, site marked, pre-op evaluation, timeout performed, IV checked, risks and benefits discussed and monitors and equipment checked  Epidural Patient position: sitting Prep: DuraPrep Patient monitoring: heart rate and blood pressure Approach: midline Location: L4-L5 Injection technique: LOR saline  Needle:  Needle type: Tuohy  Needle gauge: 17 G Needle insertion depth: 4 cm Catheter size: 19 Gauge Catheter at skin depth: 8 cm Test dose: Other and negative  Assessment Events: blood not aspirated, injection not painful, no injection resistance, negative IV test and no paresthesia  Additional Notes Tolerated well. No paresthesia. Neg CSF. Neg test dose with lidocaine 1%Reason for block:procedure for pain

## 2015-08-10 ENCOUNTER — Encounter (HOSPITAL_COMMUNITY): Payer: Self-pay

## 2015-08-10 MED ORDER — ONDANSETRON HCL 4 MG PO TABS: 4.0000 mg | ORAL_TABLET | ORAL | Status: DC | PRN

## 2015-08-10 MED ORDER — ZOLPIDEM TARTRATE 5 MG PO TABS: 5.0000 mg | ORAL_TABLET | Freq: Every evening | ORAL | Status: DC | PRN

## 2015-08-10 MED ORDER — LANOLIN HYDROUS EX OINT: TOPICAL_OINTMENT | CUTANEOUS | Status: DC | PRN

## 2015-08-10 MED ORDER — PRENATAL MULTIVITAMIN CH
1.0000 | ORAL_TABLET | Freq: Every day | ORAL | Status: DC
  Administered 2015-08-10 – 2015-08-11 (×2): 1 via ORAL
  Filled 2015-08-10 (×2): qty 1

## 2015-08-10 MED ORDER — SIMETHICONE 80 MG PO CHEW: 80.0000 mg | CHEWABLE_TABLET | ORAL | Status: DC | PRN

## 2015-08-10 MED ORDER — WITCH HAZEL-GLYCERIN EX PADS: 1.0000 "application " | MEDICATED_PAD | CUTANEOUS | Status: DC | PRN

## 2015-08-10 MED ORDER — BENZOCAINE-MENTHOL 20-0.5 % EX AERO: 1.0000 "application " | INHALATION_SPRAY | CUTANEOUS | Status: DC | PRN

## 2015-08-10 MED ORDER — ONDANSETRON HCL 4 MG/2ML IJ SOLN: 4.0000 mg | INTRAMUSCULAR | Status: DC | PRN

## 2015-08-10 MED ORDER — ACETAMINOPHEN 325 MG PO TABS: 650.0000 mg | ORAL_TABLET | ORAL | Status: DC | PRN

## 2015-08-10 MED ORDER — SENNOSIDES-DOCUSATE SODIUM 8.6-50 MG PO TABS
2.0000 | ORAL_TABLET | ORAL | Status: DC
  Administered 2015-08-11: 2 via ORAL
  Filled 2015-08-10: qty 2

## 2015-08-10 MED ORDER — IBUPROFEN 600 MG PO TABS
600.0000 mg | ORAL_TABLET | Freq: Four times a day (QID) | ORAL | Status: DC
  Administered 2015-08-10 – 2015-08-11 (×6): 600 mg via ORAL
  Filled 2015-08-10 (×6): qty 1

## 2015-08-10 MED ORDER — TETANUS-DIPHTH-ACELL PERTUSSIS 5-2.5-18.5 LF-MCG/0.5 IM SUSP: 0.5000 mL | Freq: Once | INTRAMUSCULAR | Status: DC

## 2015-08-10 MED ORDER — DIBUCAINE 1 % RE OINT: 1.0000 "application " | TOPICAL_OINTMENT | RECTAL | Status: DC | PRN

## 2015-08-10 MED ORDER — DIPHENHYDRAMINE HCL 25 MG PO CAPS: 25.0000 mg | ORAL_CAPSULE | Freq: Four times a day (QID) | ORAL | Status: DC | PRN

## 2015-08-10 NOTE — Lactation Note (Signed)
This note was copied from a baby's chart. Lactation Consultation Note  Patient Name: [name] [id number] Date: 08/10/2015 Reason for consult: Initial assessment;Infant < 6lbs Infant is 16 hours old and seen by [facility] for initial assessment. Baby was born at [redacted]w[redacted]d & weighed 5+3.6# at birth. Baby was skin-to-skin with mom when LC entered. Mom reports that baby had a bath not too long ago so planned to BF in ~20 mins; last BF was ~12pm for 15 mins. Room was full of visitors so pt was not interested in trying at that moment. Mom reported that she did not BF her other children so is unsure about proper latch. Mom reports no pain but has a little discomfort with initial latch that goes away after a few minutes. Mom stated she has a personal pump at home from insurance. Provided mom with BF booklet, BF resources, & feeding log; discussed O/P services, LC number, & support group. Mom asked about a hand pump - showed mom how to use & clean the hand pump. Reviewed BF pages in the Baby & Me booklet; discussed milk storage, engorgement & nipple soreness prevention and care, latch tips, & BF positions. Discussed how since baby is <6 lbs that they will be monitoring baby's wt to see if she should start post pumping & supplementing with her milk. Mom reports she has been shown how to hand express into a spoon. Mom reported no other questions at this time. Encouraged mom to ask for LC at next BF to assess/ help with latch.  Maternal Data    Feeding    LATCH Score/Interventions                      Lactation Tools Discussed/Used Pump Review: Setup, frequency, and cleaning;Milk Storage   Consult Status Consult Status: Follow-up Date: 08/11/15 Follow-up type: In-patient    Oneal Grout 08/10/2015, 4:06 PM

## 2015-08-10 NOTE — Progress Notes (Addendum)
Patient ID: [name], female   DOB: [birth year], 35 y.o.   MRN: [id number] PPD # 1 SVD  S:  Reports feeling tired, but well.             Tolerating po/ No nausea or vomiting             Bleeding is light             Pain controlled with ibuprofen (OTC)             Up ad lib / ambulatory / voiding without difficulties    Newborn  Information for the patient's newborn:  [name], [name] [[id number]]  female  breast feeding "went well yesterday, but today she keeps falling asleep"   O:  A & O x 3, in no apparent distress              VS:  Filed Vitals:   08/10/15 0035 08/10/15 0045 08/10/15 0120 08/10/15 0220  BP: 106/70 109/68 108/57 100/62  Pulse: 91 92 93 96  Temp:   98.3 F (36.8 C) 98.2 F (36.8 C)  TempSrc:   Oral Oral  Resp:   18 18  Height:      Weight:      SpO2:   100% 99%    LABS:  Recent Labs  08/09/15 0750  WBC 8.1  HGB 10.0*  HCT 30.1*  PLT 200    Blood type: --/--/O POS (02/17 0750)  Rubella: Immune (08/30 0000)   I&O: I/O last 3 completed shifts: In: -  Out: 900 [Urine:600; Blood:300]             Lungs: Clear and unlabored  Heart: regular rate and rhythm / no murmurs  Abdomen: soft, non-tender, non-distended             Fundus: firm, non-tender, U-1  Perineum: intact, no edema  Lochia: minimal  Extremities: No edema, no calf pain or tenderness, No Homans    A/P: PPD # 1  35 y.o., [id number]   Principal Problem:   Postpartum care following vaginal delivery (2/17) Active Problems:   Pregnancy   Doing well - stable status  Routine post partum orders  Encouraged to seek assistance from RN and [facility] today  Anticipate discharge tomorrow    Raelyn Mora, M, MSN, CNM 08/10/2015, 9:33 AM

## 2015-08-11 MED ORDER — IBUPROFEN 600 MG PO TABS: 600.0000 mg | ORAL_TABLET | Freq: Four times a day (QID) | ORAL | Status: DC

## 2015-08-11 NOTE — Anesthesia Postprocedure Evaluation (Signed)
Anesthesia Post Note  Patient: [name]  Procedure(s) Performed: * No procedures listed *  Patient location during evaluation: Mother Baby Anesthesia Type: Epidural Level of consciousness: awake and alert Pain management: pain level controlled Vital Signs Assessment: post-procedure vital signs reviewed and stable Respiratory status: spontaneous breathing, nonlabored ventilation and respiratory function stable Cardiovascular status: stable Postop Assessment: no headache, no backache and epidural receding Anesthetic complications: no    Last Vitals:  Filed Vitals:   08/10/15 1355 08/10/15 1811  BP: 110/72 107/65  Pulse: 86 85  Temp: 36.6 C 36.9 C  Resp: 18 16    Last Pain:  Filed Vitals:   08/11/15 0908  PainSc: 0-No pain                 Dejour Vos J

## 2015-08-11 NOTE — Lactation Note (Signed)
This note was copied from a baby's chart. Lactation Consultation Note  Patient Name: [name] [name] Date: 08/11/2015 Reason for consult: Follow-up assessment;Infant < 6lbs Baby asleep. LC left phone number for Mom to call with next feeding for LC to observe and help Mom with feeding plan for d/c.  Maternal Data    Feeding Feeding Type: Breast Fed Length of feed: 1 min  LATCH Score/Interventions Latch: Too sleepy or reluctant, no latch achieved, no sucking elicited. Intervention(s): Skin to skin;Teach feeding cues;Waking techniques Intervention(s): Adjust position;Assist with latch;Breast massage;Breast compression  Audible Swallowing: None Intervention(s): Skin to skin;Hand expression  Type of Nipple: Everted at rest and after stimulation  Comfort (Breast/Nipple): Soft / non-tender     Hold (Positioning): Assistance needed to correctly position infant at breast and maintain latch.  LATCH Score: 5  Lactation Tools Discussed/Used Tools: Pump Breast pump type: Double-Electric Breast Pump   Consult Status Consult Status: Follow-up Date: 08/11/15 Follow-up type: In-patient    [name] 08/11/2015, 8:57 AM

## 2015-08-11 NOTE — Discharge Summary (Signed)
       OB Discharge Summary  Patient Name: [name] DOB: [birth year] MRN: [id number]  Date of admission: 08/09/2015 Delivering MD: Noland Fordyce   Date of discharge: 08/11/2015  Admitting diagnosis: INDUCTION Intrauterine pregnancy: [redacted]w[redacted]d     Secondary diagnosis:Principal Problem:   Postpartum care following vaginal delivery (2/17) Active Problems:   Pregnancy  Additional problems:IUGR     Discharge diagnosis: Term Pregnancy Delivered                                                                     Post partum procedures:none  Augmentation: AROM, Pitocin and Foley Balloon  Complications: None  Hospital course:  Induction of Labor With Vaginal Delivery   35 y.o. yo [phone] at [redacted]w[redacted]d was admitted to the hospital 08/09/2015 for induction of labor.  Indication for induction: IUGR.  Patient had an uncomplicated labor course as follows: Membrane Rupture Time/Date: 8:15 PM ,08/09/2015   Intrapartum Procedures: Episiotomy: None [1]                                         Lacerations:  None [1]  Patient had delivery of a Viable infant.  Information for the patient's newborn:  Shere, Eisenhart [[id number]]  Delivery Method: Vaginal, Spontaneous Delivery (Filed from Delivery Summary)   08/09/2015  Details of delivery can be found in separate delivery note.  Patient had a routine postpartum course. Patient is discharged home 08/11/2015.   Physical exam  Filed Vitals:   08/10/15 0120 08/10/15 0220 08/10/15 1355 08/10/15 1811  BP: 108/57 100/62 110/72 107/65  Pulse: 93 96 86 85  Temp: 98.3 F (36.8 C) 98.2 F (36.8 C) 97.8 F (36.6 C) 98.5 F (36.9 C)  TempSrc: Oral Oral Oral Oral  Resp: Height:      Weight:      SpO2: 100% 99% 100% 100%   General: alert Lochia: appropriate Uterine Fundus: firm Incision: N/A DVT Evaluation: No evidence of DVT seen on physical exam. Labs: Lab Results  Component Value Date   WBC 8.1 08/09/2015   HGB 10.0*  08/09/2015   HCT 30.1* 08/09/2015   MCV 84.3 08/09/2015   PLT 200 08/09/2015   No flowsheet data found.  Discharge instruction: per After Visit Summary and "Baby and Me Booklet".  After Visit Meds:    Medication List    TAKE these medications        [employer]S PRENATAL GUMMY PO  Take 1 each by mouth 2 (two) times daily.     ibuprofen 600 MG tablet  Commonly known as:  ADVIL,MOTRIN  Take 1 tablet (600 mg total) by mouth every 6 (six) hours.        Diet: routine diet  Activity: Advance as tolerated. Pelvic rest for 6 weeks.   Outpatient follow up:6 weeks Follow up Appt:No future appointments. Follow up visit: No Follow-up on file.  Postpartum contraception: IUD Mirena  Newborn Data: Live born female  Birth Weight: 5 lb 3.6 oz (2370 g) APGAR: 8, 9  Baby Feeding: Breast Disposition:home with mother   08/11/2015 Lendon Colonel., MD

## 2015-08-11 NOTE — Lactation Note (Signed)
This note was copied from a baby's chart. Lactation Consultation Note  Patient Name: [name] Bound [id number] Date: 08/11/2015 Reason for consult: Follow-up assessment;Difficult latch;Infant < 6lbs Baby now 27 hours old. BF X 5 for 1-15 minutes, on average 5 minutes. Mom supplementing now 2-13 ml. Adequate output, 5% weight loss. Assisted Mom with positioning to obtain deep latch on left breast.  Mom reports baby sleepy at the breast, baby sleepy at this visit. Lots of stimulation needed to keep baby suckling. Initiated 5 fr feeding tube/syringe at breast to give supplement and to help baby stay engaged at the breast. After few attempts baby was able to take 10 ml of formula at the breast with this method. Baby has been spitty at times per Mom. Advised Mom to keep baby upright for 15 minutes after feeding to help with this. Encouraged Mom to post pump for 15 minutes after feedings to encourage milk production. Advised to limit BF to 1 breast for 30 minutes alternating breast each feeding. If 5 fr feeding tube system becomes overwhelming, use bottle to supplement after BF. Let FOB give bottle while Mom pumps. Encouraged Mom to call with next feeding to see how baby does with on the right breast. Mom reports she was not successful with BF her 2 older children, but reports her milk did come in well. Supplemental guidelines reviewed with Mom, advised 10 ml plus today with each feeding, increasing per hours of age and to satisfy baby. Advised baby should be at the breast 8-12 times in 24 hours and with feeding ques. Due to size of baby at least every 3 hours. LC left phone number for Mom to call.   Maternal Data    Feeding Feeding Type: Formula Length of feed: 30 min  LATCH Score/Interventions Latch: Repeated attempts needed to sustain latch, nipple held in mouth throughout feeding, stimulation needed to elicit sucking reflex. Intervention(s): Skin to skin;Teach feeding cues;Waking  techniques Intervention(s): Adjust position;Assist with latch;Breast massage;Breast compression  Audible Swallowing: None Intervention(s): Skin to skin;Hand expression  Type of Nipple: Everted at rest and after stimulation  Comfort (Breast/Nipple): Soft / non-tender     Hold (Positioning): Assistance needed to correctly position infant at breast and maintain latch. Intervention(s): Breastfeeding basics reviewed;Support Pillows;Position options;Skin to skin  LATCH Score: 6  Lactation Tools Discussed/Used Tools: Pump;53F feeding tube / Syringe Breast pump type: Double-Electric Breast Pump   Consult Status Consult Status: Follow-up Date: 08/11/15 Follow-up type: In-patient    Alfred Levins 08/11/2015, 10:00 AM

## 2015-08-11 NOTE — Lactation Note (Addendum)
This note was copied from a baby's chart. Lactation Consultation Note  Patient Name: [name] [id number] Date: 08/11/2015 Reason for consult: Follow-up assessment;Difficult latch;Infant < 6lbs Mom called for assist with latch. Right nipple is inverted and Mom reports baby has not latched to the right breast. Demonstrated using hand pump to pre-pump, assisted with latch using breast compression but baby could not sustain latch. Tongue thrusting. Initiated #20 nipple shield and after few attempts baby did develop good depth and demonstrated some good suckling bursts. Scant amount of colostrum visible in the nipple shield. Tried to supplement at breast using 5 fr feeding tube as with previous feeding but baby did not transfer with the nipple shield. Demonstrated to parents how to finger feed using 5 fr feeding tube/syringe system. Encouraged Mom to use bottle such as Dr. Manson Passey #1 and supplement using this method to be more efficient since baby is so small. Reviewed protecting calories with feeding. Mom did demonstrate applying nipple shield and hand out given reviewed care of nipple shield.  Feeding plan discussed: Encouraged to BF 8-12 times or more in 24 hours.  For now, BF on 1 breast for up to 30 minutes. On left breast if baby able to latch without nipple shield Mom can use 5 fr feeding tube/syringe to supplement at breast but advised baby needs to be able to complete feeding in the 30 minutes otherwise use bottle to supplement to conserve calories with feeding and so Mom can post pump. Have FOB give bottle. Alternate breast each feeding.  With using nipple shield on right breast, advised to use bottle to supplement. Baby to have supplement of at least 10 ml today with each feeding increasing per hours of age and to satisfy baby. Supplemental guidelines given to parents and reviewed with parents. Mom to post pump every 3 hours for 15 minutes to encourage milk production and protect milk supply.  Mom reports she has DEBP at home.  Use EBM when available to supplement. Monitor void/stools OP f/u scheduled with lactation for Tuesday 08/20/15 at 2:30p Peds f/u tomorrow. Discussed possibly staying so Mom would have help with BF. FOB very anxious to go home.  Engorgement care reviewed if needed, refer to Baby N Me booklet, page 24. Breast milk storage guidelines reviewed, refer to page 25 Baby N Me booklet. Encouraged to call for questions/concerns.  Dr. Jerrell Mylar called with BF report.  Maternal Data    Feeding Feeding Type: Formula Length of feed: 30 min (off/on)  LATCH Score/Interventions Latch: Repeated attempts needed to sustain latch, nipple held in mouth throughout feeding, stimulation needed to elicit sucking reflex. (initiated #20 nipple shield, inverted nipple) Intervention(s): Adjust position;Assist with latch;Breast massage;Breast compression  Audible Swallowing: A few with stimulation  Type of Nipple: Inverted (right nipple inverted, left erect) Intervention(s): Hand pump;Double electric pump  Comfort (Breast/Nipple): Soft / non-tender     Hold (Positioning): Assistance needed to correctly position infant at breast and maintain latch. Intervention(s): Breastfeeding basics reviewed;Position options;Support Pillows;Skin to skin  LATCH Score: 5  Lactation Tools Discussed/Used Tools: Nipple Shields;Pump;45F feeding tube / Syringe Nipple shield size: 20 Breast pump type: Double-Electric Breast Pump   Consult Status Consult Status: Complete Date: 08/11/15 Follow-up type: In-patient    Alfred Levins 08/11/2015, 1:50 PM

## 2015-08-20 ENCOUNTER — Ambulatory Visit (HOSPITAL_COMMUNITY)
Admit: 2015-08-20 | Discharge: 2015-08-20 | Disposition: A | Discharge status: Home / Self Care | Payer: BLUE CROSS/BLUE SHIELD | Attending: Obstetrics | Admitting: Obstetrics

## 2015-08-20 NOTE — Lactation Note (Signed)
Lactation Consult  Mother's reason for visit:  Melanie not latching, LMS Visit Type:  Outpatient Appointment Notes:  Mom reports Melanie is not latching to right breast. She will latch to left breast and nurse for 30 minutes off/on but lots of stimulation needed to keep Melanie nursing. Mom is not obtaining much breast milk with pumping, sometimes less than an ounce, sometimes nothing. Mom's right nipple is inverted, she was using a #20 nipple shield at d/c but stopped after going home. Started supplementing per bottle, not using 5 fr feeding tube system at breast since d/c home. This is Mom's 3rd Melanie but 1st time BF. [name] in now 82 days old.  Consult:  Initial Lactation Consultant:  [name]  ________________________________________________________________________    Melanie's Name: [name] Date of Birth: 08/09/2013 Pediatrician: Dr. Eddie Shepherd - [facility] Gender: female Gestational Age: [redacted]w[redacted]d (At Birth) Birth Weight: 5 lb 3.6 oz (2370 g) Weight at Discharge:  Weight: (!) 4 lb 15.7 oz (2259 g) Date of Discharge: 08/11/2015 [facility] Weights   08/09/15 2326 08/11/15 0005  Weight: 5 lb 3.6 oz (2370 g) 4 lb 15.7 oz (2259 g)   Last weight taken from location outside of [facility]: Mom could not remember weight other than greater than 5 lb.  Location:Pediatrician's office Weight today: 5 lb. 12.2 oz/2614 gm.  Melanie has gained 12.5 oz in past 9 days since d/c home per d/c weight and weight today.     ________________________________________________________________________  Mother's Name: [name] Type of delivery:  SVB Breastfeeding Experience:  1st time BF Maternal Medical Conditions:  None reported Maternal Medications: PNV  ________________________________________________________________________  Breastfeeding History (Post Discharge)  Frequency of breastfeeding:  Every 2-3 hours left breast only, not latching  to right breast.  Duration of feeding:  Up to 30 minutes. Melanie sleepy after about 5 minutes at the breast. Lots of stimulation to keep Melanie nursing  Supplementation  Formula:  Volume 75 ml Frequency:  Every 2 hours Total volume per day:  900 ml       Brand: Similac  Via bottle  Pumping  Type of pump:  Ameda Frequency:  Every 2-3 hours Volume:  Sometimes less than 1 ounce, sometimes nothing  Infant Intake and Output Assessment  Voids:  10-12 in 24 hrs.  Color:  Clear yellow Stools:  2 in 24 hrs.  Color:  Yellow  ________________________________________________________________________  Maternal Breast Assessment  Breast:  Soft Nipple:  Left erect, right inverted Pain level:  0   _______________________________________________________________________ Feeding Assessment/Evaluation  Initial feeding assessment:  Infant's oral assessment:  Variance. Melanie does have short labial frenulum.   Positioning:  Cross cradle Left breast  LATCH documentation:  Latch:  1 = Repeated attempts needed to sustain latch, nipple held in mouth throughout feeding, stimulation needed to elicit sucking reflex.  Audible swallowing:  0 = None  Type of nipple:  2 = Everted at rest and after stimulation  Comfort (Breast/Nipple):  2 = Soft / non-tender  Hold (Positioning):  1 = Assistance needed to correctly position infant at breast and maintain latch  LATCH score:  6  Attached assessment:  Shallow with initial latch, demonstrated to Mom how to use breast compression for more depth. Melanie has difficulty sustaining good depth and keeping bottom lip well flanged. Demonstrated how to flange lips well for depth  Suck assessment:  Nonnutritive. Melanie does a lot of chewing at the breast, no audible swallows noted, stimulation needed to keep Melanie nursing, falls asleep.  Pre-feed weight:  2614 g  (5 lb. 12.2 oz.) Post-feed weight:  2616 g (5 lb. 12.3 oz.) Amount transferred:  2 ml   With nursing off/on for 15 minutes.   Additional Feeding Assessment -   Infant's oral assessment:  Variance  Positioning:  Football Right breast  LATCH documentation:  Latch:  1 = Repeated attempts needed to sustain latch, nipple held in mouth throughout feeding, stimulation needed to elicit sucking reflex.  Audible swallowing:  0 = None  Type of nipple:  0 = Inverted  Comfort (Breast/Nipple):  2 = Soft / non-tender  Hold (Positioning):  1 = Assistance needed to correctly position infant at breast and maintain latch  LATCH score:  4  Attached assessment:  Deep with  Using #20 nipple shield. A better suckling pattern noted. Still now audible swallows.   Suck assessment:  Non-nutritive  Tools:  Nipple shield 20 mm Instructed on use and cleaning of tool:  Yes.     Pre-feed weight: 2616 g  (5 lb. 12.3 oz.) Post-feed weight:  2618 g (5 lb. 12.3 oz.) Amount transferred:  2 ml, this was the 2 ml of formula used to pre-fill the nipple shield. Melanie did not transfer milk Amount supplemented:  60 ml of Formula via bottle  Total amount pumped post feed:  R 1 ml    L 8 ml  With pumping for 15 minutes  Total amount transferred:  4 ml Total supplement given:  60 ml  Per assessment today, Melanie is not transferring milk but Mom has also lost her milk supply. With pumping LC noted Mom has not been turning pump up to maximum pumping speed/strength. With setting she has been using not much stimulation observed. Mom using Ameda Purely Yours Pump. Encouraged Mom to consider renting DEBP for better stimulation to help her re-lactate, Mom declined reporting she will turn up her pump as instructed and see if this improves her volume.  Due to LMS, small Melanie, advised Mom not to keep Melanie at breast for more than 10-15 minutes to conserve calorie usage at the breast. Once milk volume increases this will change, but stressed importance of Melanie being to breast to remember how to BF/latch. With latching to  right breast, pre-pump and use #20 nipple shield to help Melanie latch. Pre-fill as needed using curved tipped syringe to keep Melanie nursing. Continue to latch on left breast without nipple shield unless needed. Post pump on strongest setting she can tolerate for 15-30 minutes every 3 hours to increase milk volume. Supplement 2-3 oz of breast milk/formula every 2-3 hours. Start Fenugreek supplements per directions on handout. Lactation cookies. Consider renting hospital grade DEBP for better results. Pump/bottle only at night for more rest. OP f/u Tuesday, 08/27/15 2:30 to assess progress.

## 2015-08-27 ENCOUNTER — Ambulatory Visit (HOSPITAL_COMMUNITY): Admission: RE | Admit: 2015-08-27 | Payer: BLUE CROSS/BLUE SHIELD | Source: Ambulatory Visit

## 2017-03-14 ENCOUNTER — Emergency Department: Payer: BLUE CROSS/BLUE SHIELD

## 2017-03-14 ENCOUNTER — Emergency Department
Admission: EM | Admit: 2017-03-14 | Discharge: 2017-03-14 | Disposition: A | Discharge status: Home / Self Care | Payer: BLUE CROSS/BLUE SHIELD | Attending: Emergency Medicine | Admitting: Emergency Medicine

## 2017-03-14 ENCOUNTER — Encounter: Payer: Self-pay | Admitting: Emergency Medicine

## 2017-03-14 DIAGNOSIS — M545 Low back pain, unspecified: Secondary | ICD-10-CM

## 2017-03-14 DIAGNOSIS — R51 Headache: Secondary | ICD-10-CM | POA: Diagnosis not present

## 2017-03-14 DIAGNOSIS — Z041 Encounter for examination and observation following transport accident: Secondary | ICD-10-CM | POA: Insufficient documentation

## 2017-03-14 LAB — POCT PREGNANCY, URINE: PREG TEST UR: NEGATIVE

## 2017-03-14 MED ORDER — IBUPROFEN 600 MG PO TABS: 600.0000 mg | ORAL_TABLET | Freq: Three times a day (TID) | ORAL | 0 refills | Status: DC | PRN

## 2017-03-14 MED ORDER — IBUPROFEN 600 MG PO TABS
600.0000 mg | ORAL_TABLET | Freq: Once | ORAL | Status: AC
  Administered 2017-03-14: 600 mg via ORAL
  Filled 2017-03-14: qty 1
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
## 2017-03-14 NOTE — Discharge Instructions (Signed)
Follow-up with your primary care doctor if any continued problems. Begin taking ibuprofen 600 mg 3 times a day with food. Warm compresses or ice to your back as needed for discomfort.

## 2017-08-05 ENCOUNTER — Encounter (HOSPITAL_BASED_OUTPATIENT_CLINIC_OR_DEPARTMENT_OTHER): Payer: Self-pay | Admitting: Emergency Medicine

## 2017-08-05 ENCOUNTER — Emergency Department (HOSPITAL_BASED_OUTPATIENT_CLINIC_OR_DEPARTMENT_OTHER)
Admission: EM | Admit: 2017-08-05 | Discharge: 2017-08-05 | Disposition: A | Discharge status: Home / Self Care | Payer: BLUE CROSS/BLUE SHIELD | Attending: Emergency Medicine | Admitting: Emergency Medicine

## 2017-08-05 ENCOUNTER — Emergency Department (HOSPITAL_BASED_OUTPATIENT_CLINIC_OR_DEPARTMENT_OTHER): Payer: BLUE CROSS/BLUE SHIELD

## 2017-08-05 ENCOUNTER — Other Ambulatory Visit: Payer: Self-pay

## 2017-08-05 DIAGNOSIS — S3992XA Unspecified injury of lower back, initial encounter: Secondary | ICD-10-CM | POA: Diagnosis present

## 2017-08-05 DIAGNOSIS — M545 Low back pain: Secondary | ICD-10-CM | POA: Insufficient documentation

## 2017-08-05 DIAGNOSIS — M7918 Myalgia, other site: Secondary | ICD-10-CM

## 2017-08-05 DIAGNOSIS — Y92410 Unspecified street and highway as the place of occurrence of the external cause: Secondary | ICD-10-CM | POA: Diagnosis not present

## 2017-08-05 DIAGNOSIS — Y9389 Activity, other specified: Secondary | ICD-10-CM | POA: Diagnosis not present

## 2017-08-05 DIAGNOSIS — Y999 Unspecified external cause status: Secondary | ICD-10-CM | POA: Insufficient documentation

## 2017-08-05 DIAGNOSIS — R0789 Other chest pain: Secondary | ICD-10-CM | POA: Diagnosis not present

## 2017-08-05 LAB — PREGNANCY, URINE: PREG TEST UR: NEGATIVE

## 2017-08-05 MED ORDER — NAPROXEN 250 MG PO TABS
500.0000 mg | ORAL_TABLET | Freq: Once | ORAL | Status: AC
  Administered 2017-08-05: 500 mg via ORAL
  Filled 2017-08-05: qty 2

## 2017-08-05 MED ORDER — CYCLOBENZAPRINE HCL 5 MG PO TABS: 5.0000 mg | ORAL_TABLET | Freq: Every evening | ORAL | 0 refills | Status: AC | PRN

## 2017-08-05 MED FILL — CYCLOBENZAPRINE 5 MG TABLET: 5 | 10 days supply | Qty: 10 | Fill #0
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
## 2022-07-10 ENCOUNTER — Other Ambulatory Visit: Payer: Self-pay | Admitting: Otolaryngology

## 2022-07-10 DIAGNOSIS — H9042 Sensorineural hearing loss, unilateral, left ear, with unrestricted hearing on the contralateral side: Secondary | ICD-10-CM

## 2022-07-26 ENCOUNTER — Ambulatory Visit
Admission: RE | Admit: 2022-07-26 | Discharge: 2022-07-26 | Disposition: A | Discharge status: Home / Self Care | Payer: Self-pay | Source: Ambulatory Visit | Attending: Otolaryngology | Admitting: Otolaryngology

## 2022-07-26 DIAGNOSIS — H9042 Sensorineural hearing loss, unilateral, left ear, with unrestricted hearing on the contralateral side: Secondary | ICD-10-CM

## 2022-08-05 ENCOUNTER — Other Ambulatory Visit: Payer: Self-pay | Admitting: Otolaryngology

## 2022-08-05 ENCOUNTER — Ambulatory Visit
Admission: RE | Admit: 2022-08-05 | Discharge: 2022-08-05 | Disposition: A | Discharge status: Home / Self Care | Payer: BC Managed Care – PPO | Source: Ambulatory Visit | Attending: Otolaryngology | Admitting: Otolaryngology

## 2022-08-05 DIAGNOSIS — H9042 Sensorineural hearing loss, unilateral, left ear, with unrestricted hearing on the contralateral side: Secondary | ICD-10-CM

## 2022-08-15 ENCOUNTER — Other Ambulatory Visit: Payer: Self-pay
